# Patient Record
Sex: Male | Born: 2000 | Race: White | Hispanic: No | Marital: Single | Smoking: Never smoker
Health system: Southern US, Community
[De-identification: ages and names within clinical notes are randomized; demographics above are authoritative.]

## PROBLEM LIST (undated history)

## (undated) DIAGNOSIS — S62109A Fracture of unspecified carpal bone, unspecified wrist, initial encounter for closed fracture: Secondary | ICD-10-CM

---
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
## 2021-05-08 ENCOUNTER — Other Ambulatory Visit: Payer: Self-pay

## 2021-05-08 ENCOUNTER — Ambulatory Visit: Payer: Self-pay

## 2021-05-08 ENCOUNTER — Ambulatory Visit
Admission: EM | Admit: 2021-05-08 | Discharge: 2021-05-08 | Disposition: A | Discharge status: Home / Self Care | Payer: Federal, State, Local not specified - PPO | Attending: Emergency Medicine | Admitting: Emergency Medicine

## 2021-05-08 DIAGNOSIS — J45909 Unspecified asthma, uncomplicated: Secondary | ICD-10-CM

## 2021-05-08 MED ORDER — PREDNISONE 20 MG PO TABS: 40.0000 mg | ORAL_TABLET | Freq: Every day | ORAL | 0 refills | Status: AC

## 2021-05-08 MED ORDER — ALBUTEROL SULFATE HFA 108 (90 BASE) MCG/ACT IN AERS: 2.0000 | INHALATION_SPRAY | RESPIRATORY_TRACT | 0 refills | Status: AC | PRN

## 2021-05-08 NOTE — ED Provider Notes (Signed)
[facility]    CSN: [id number] Arrival date & time: 05/08/21  1054      History   Chief Complaint Chief Complaint  Patient presents with   Allergic Reaction    HPI Bill Davidson is a 21 y.o. male.   Patient presents with nasal congestion, shortness of breath and wheezing over the last week. Exposure to dust at work as he works in [employer]. History of seasonal allergies, not taking zyrtec consistently.  Denies chest pain or tightness, fever, chills, body aches, URI symptoms.  Has attempted use of albuterol inhaler that he borrowed from a friend.  Past Medical History:  Diagnosis Date   Broken wrist    left wrist at age 39    There are no problems to display for this patient.   History reviewed. No pertinent surgical history.     Home Medications    Prior to Admission medications   Medication Sig Start Date End Date Taking? Authorizing Provider  HYDROcodone-acetaminophen (NORCO/VICODIN) 5-325 MG tablet Take 1-2 tablets by mouth every 4 (four) hours as needed for moderate pain. 11/14/15   Loleta Rose, MD  meloxicam (MOBIC) 7.5 MG tablet Take 1 tablet (7.5 mg total) by mouth daily. 10/25/15   Hassan Rowan, MD    Family History History reviewed. No pertinent family history.  Social History Social History   Tobacco Use   Smoking status: Never   Smokeless tobacco: Never  Vaping Use   Vaping Use: Never used  Substance Use Topics   Alcohol use: Yes   Drug use: Never     Allergies   Patient has no known allergies.   Review of Systems Review of Systems  Constitutional: Negative.   HENT:  Positive for congestion. Negative for dental problem, drooling, ear discharge, ear pain, facial swelling, hearing loss, mouth sores, nosebleeds, postnasal drip, rhinorrhea, sinus pressure, sinus pain, sneezing, sore throat, tinnitus, trouble swallowing and voice change.   Respiratory:  Positive for cough, shortness of breath and wheezing. Negative for apnea,  choking, chest tightness and stridor.   Cardiovascular: Negative.   Gastrointestinal: Negative.   Skin: Negative.   Neurological: Negative.     Physical Exam Triage Vital Signs ED Triage Vitals  Enc Vitals Group     BP 05/08/21 1114 (!) 142/79     Pulse Rate 05/08/21 1114 72     Resp 05/08/21 1114 18     Temp 05/08/21 1114 (!) 97.5 F (36.4 C)     Temp Source 05/08/21 1114 Oral     SpO2 05/08/21 1114 96 %     Weight 05/08/21 1113 185 lb (83.9 kg)     Height 05/08/21 1113 5\' 9"  (1.753 m)     Head Circumference --      Peak Flow --      Pain Score 05/08/21 1113 0     Pain Loc --      Pain Edu? --      Excl. in GC? --    No data found.  Updated Vital Signs BP (!) 142/79 (BP Location: Left Arm)    Pulse 72    Temp (!) 97.5 F (36.4 C) (Oral)    Resp 18    Ht 5\' 9"  (1.753 m)    Wt 185 lb (83.9 kg)    SpO2 96%    BMI 27.32 kg/m   Visual Acuity Right Eye Distance:   Left Eye Distance:   Bilateral Distance:    Right Eye Near:   Left  Eye Near:    Bilateral Near:     Physical Exam Constitutional:      Appearance: Normal appearance.  HENT:     Head: Normocephalic.     Right Ear: Tympanic membrane, ear canal and external ear normal.     Left Ear: Tympanic membrane, ear canal and external ear normal.     Nose: Nose normal.     Mouth/Throat:     Mouth: Mucous membranes are moist.     Pharynx: Oropharynx is clear.  Eyes:     Extraocular Movements: Extraocular movements intact.  Cardiovascular:     Rate and Rhythm: Normal rate and regular rhythm.     Pulses: Normal pulses.     Heart sounds: Normal heart sounds.  Pulmonary:     Effort: Pulmonary effort is normal.     Breath sounds: Normal breath sounds.  Skin:    General: Skin is warm and dry.  Neurological:     Mental Status: He is alert and oriented to person, place, and time. Mental status is at baseline.  Psychiatric:        Mood and Affect: Mood normal.        Behavior: Behavior normal.     UC Treatments /  Results  Labs (all labs ordered are listed, but only abnormal results are displayed) Labs Reviewed - No data to display  EKG   Radiology No results found.  Procedures Procedures (including critical care time)  Medications Ordered in UC Medications - No data to display  Initial Impression / Assessment and Plan / UC Course  I have reviewed the triage vital signs and the nursing notes.  Pertinent labs & imaging results that were available during my care of the patient were reviewed by me and considered in my medical decision making (see chart for details).  Asthma due to seasonal allergies  Vital signs are stable, patient has no signs of distress, etiology of symptoms most likely related to dust exposure, prednisone burst prescribed as well as albuterol inhaler, recommended consistent use of antihistamine, recommended wearing mask at work to reduce exposure, May use humidifier at home and may need to clean air filters in house, may follow-up with urgent care or primary care doctor if symptoms persist Final Clinical Impressions(s) / UC Diagnoses   Final diagnoses:  None   Discharge Instructions   None    ED Prescriptions   None    PDMP not reviewed this encounter.   Valinda Hoar, NP 05/08/21 1154

## 2021-05-08 NOTE — ED Triage Notes (Signed)
Pt c/o allergies x1week. Pt works in [employer] and is around dust all day.

## 2021-05-08 NOTE — Discharge Instructions (Signed)
Beginning today you may take prednisone every morning with food for the next 5 days  May use albuterol inhaler taking 2 puffs every 4 hours as needed for wheezing and shortness of breath  Please try to take your antihistamine every day to reduce symptoms, if you feel like Zyrtec is no longer effective he may attempt a different antihistamine  You may attempt use of a humidifier at bedtime to moisten upper airways to reduce symptoms, if you do not have a humidifier you may attempt use of sitting in a steamy bathroom for 5 to 10 minutes  You may need to clean out the air vents in your home and change filters to reduce exposure to dust  If you are exposed to dust at work which is a trigger you may need to wearing a mask to reduce inhalation  If symptoms continue to persist may follow-up with a primary care doctor or urgent care as needed
